# Patient Record
Sex: Male | Born: 1970 | Race: Black or African American | Hispanic: No | Marital: Married | Smoking: Never smoker
Health system: Southern US, Community
[De-identification: ages and names within clinical notes are randomized; demographics above are authoritative.]

## PROBLEM LIST (undated history)

## (undated) DIAGNOSIS — I10 Essential (primary) hypertension: Secondary | ICD-10-CM

## (undated) HISTORY — DX: Essential (primary) hypertension: I10

---

## 2003-02-21 ENCOUNTER — Encounter: Payer: Self-pay | Admitting: Internal Medicine

## 2003-02-21 ENCOUNTER — Encounter: Admission: RE | Admit: 2003-02-21 | Discharge: 2003-02-21 | Payer: Self-pay | Admitting: Internal Medicine

## 2005-06-19 ENCOUNTER — Emergency Department (HOSPITAL_COMMUNITY): Admission: EM | Admit: 2005-06-19 | Discharge: 2005-06-19 | Payer: Self-pay | Admitting: Emergency Medicine

## 2006-08-26 IMAGING — CR DG LUMBAR SPINE COMPLETE 4+V
5 series · 5 of 5 positions shown · non-contrast
Comparison: None.

CLINICAL DATA: The patient fell today and has low back pain.
 LUMBAR SPINE COMPLETE ? 4 VIEWS:

[view not recorded (1 of 5)]
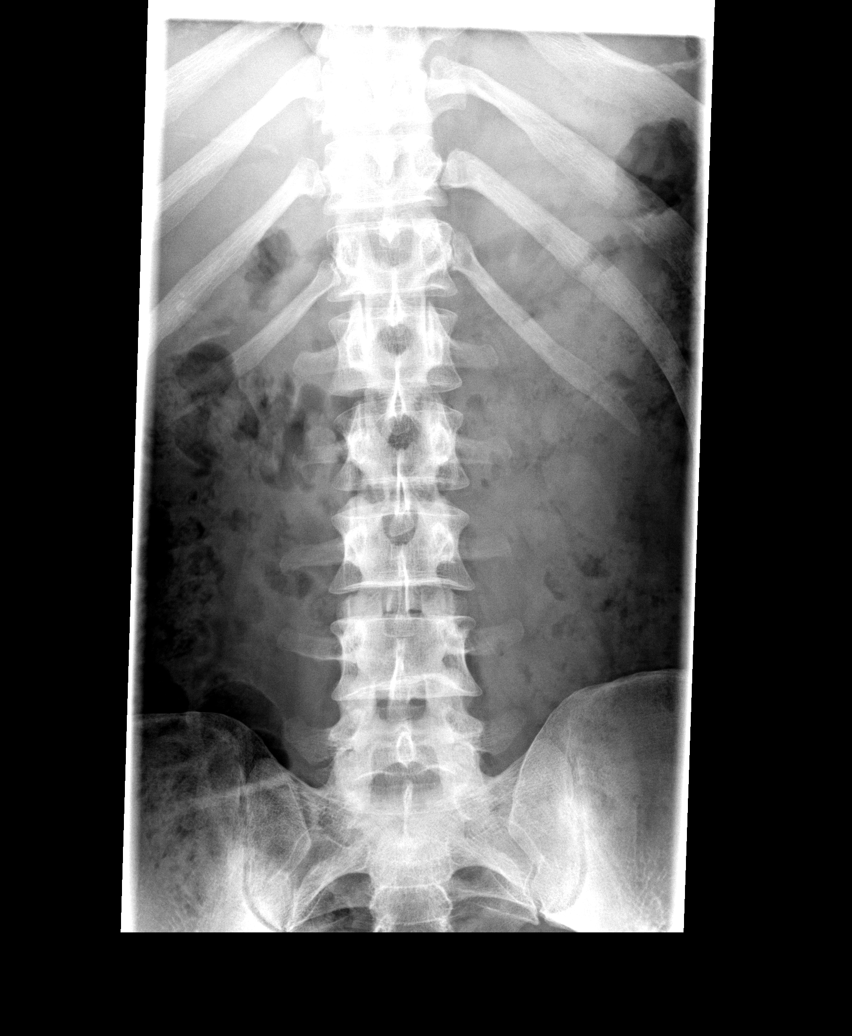

[view not recorded (2 of 5)]
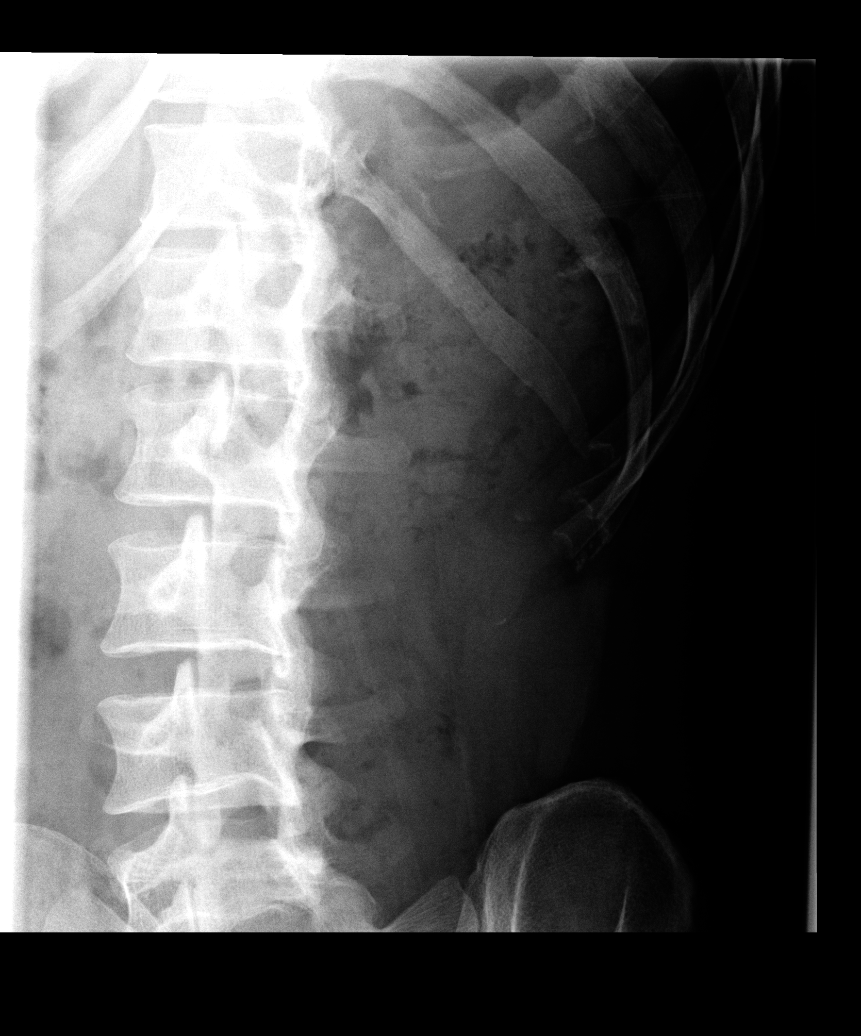

[view not recorded (3 of 5)]
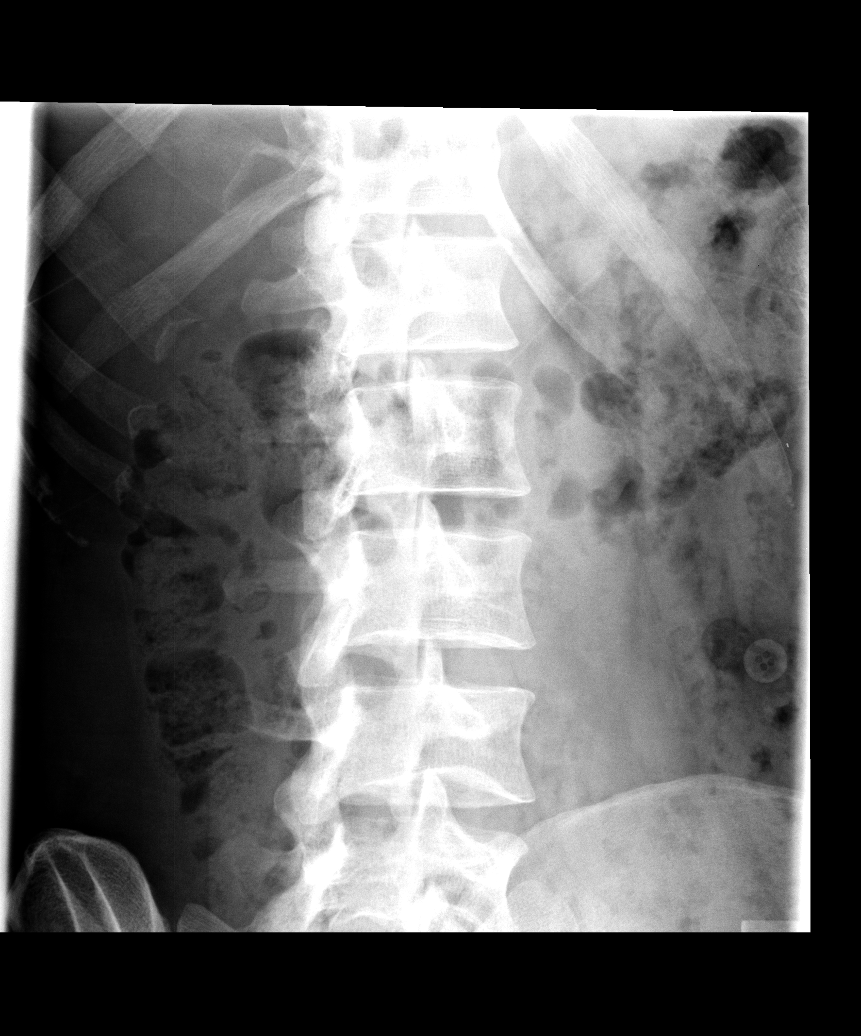

[view not recorded (4 of 5)]
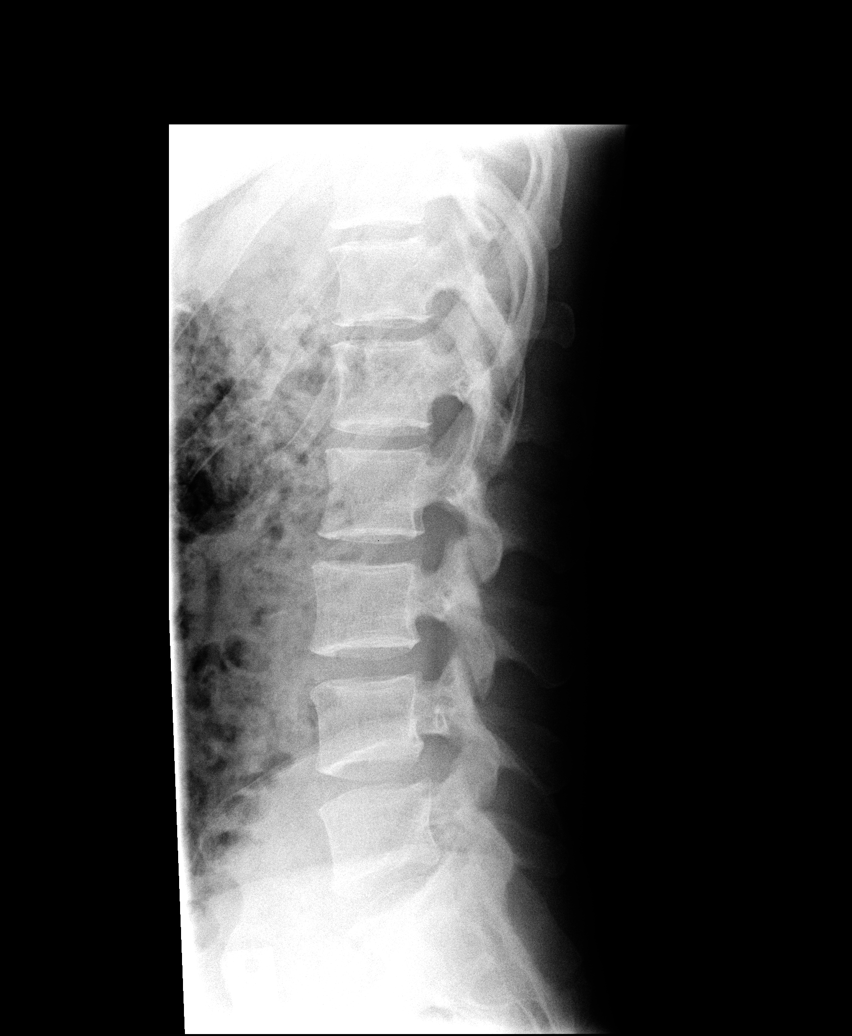

[view not recorded (5 of 5)]
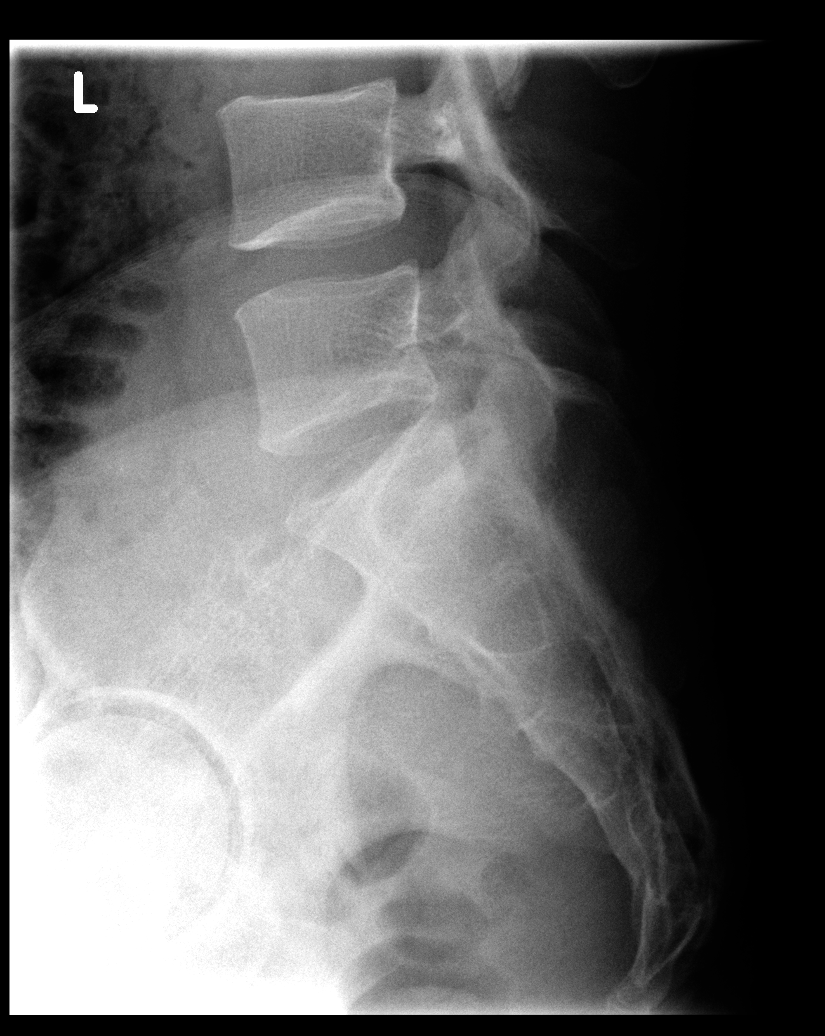

[5 of 5 positions shown; findings below may reference images not displayed]

Five lumbar type vertebrae.  No pars defects.  Satisfactory alignment.  No fracture.  There is mild sclerosis of the sacroiliac joints.
IMPRESSION: No abnormality.

## 2008-06-01 ENCOUNTER — Ambulatory Visit: Payer: Self-pay | Admitting: Family Medicine

## 2008-06-05 LAB — CONVERTED CEMR LAB
Basophils Absolute: 0 10*3/uL (ref 0.0–0.1)
Basophils Relative: 1 % (ref 0–1)
Eosinophils Absolute: 0.1 10*3/uL (ref 0.0–0.7)
Eosinophils Relative: 4 % (ref 0–5)
HCT: 46.8 % (ref 39.0–52.0)
Hemoglobin: 15.8 g/dL (ref 13.0–17.0)
Lymphocytes Relative: 48 % — ABNORMAL HIGH (ref 12–46)
Lymphs Abs: 1.6 10*3/uL (ref 0.7–4.0)
Monocytes Absolute: 0.3 10*3/uL (ref 0.1–1.0)
Monocytes Relative: 8 % (ref 3–12)
Neutro Abs: 1.4 10*3/uL — ABNORMAL LOW (ref 1.7–7.7)
Neutrophils Relative %: 40 % — ABNORMAL LOW (ref 43–77)
Platelets: 202 10*3/uL (ref 150–400)
RDW: 12.7 % (ref 11.5–15.5)

## 2008-06-09 ENCOUNTER — Telehealth: Payer: Self-pay | Admitting: Family Medicine

## 2008-11-22 ENCOUNTER — Encounter: Payer: Self-pay | Admitting: Family Medicine

## 2008-12-12 ENCOUNTER — Ambulatory Visit: Payer: Self-pay | Admitting: Family Medicine

## 2008-12-12 DIAGNOSIS — I10 Essential (primary) hypertension: Secondary | ICD-10-CM

## 2008-12-12 LAB — CONVERTED CEMR LAB
Cholesterol, target level: 200 mg/dL
HDL goal, serum: 40 mg/dL
LDL Goal: 160 mg/dL

## 2008-12-13 ENCOUNTER — Telehealth: Payer: Self-pay | Admitting: Family Medicine

## 2008-12-13 LAB — CONVERTED CEMR LAB: TSH: 3.152 microintl units/mL (ref 0.350–4.50)

## 2008-12-29 ENCOUNTER — Telehealth: Payer: Self-pay | Admitting: Family Medicine

## 2009-01-03 ENCOUNTER — Ambulatory Visit: Payer: Self-pay | Admitting: Family Medicine

## 2009-06-12 ENCOUNTER — Ambulatory Visit: Payer: Self-pay | Admitting: Family Medicine

## 2009-06-12 DIAGNOSIS — D72819 Decreased white blood cell count, unspecified: Secondary | ICD-10-CM | POA: Insufficient documentation

## 2009-06-13 LAB — CONVERTED CEMR LAB
ALT: 21 U/L
AST: 12 U/L
Albumin: 4.1 g/dL
Alkaline Phosphatase: 45 U/L
BUN: 14 mg/dL
CO2: 25 meq/L
Calcium: 9.2 mg/dL
Chloride: 101 meq/L
Cholesterol: 217 mg/dL — ABNORMAL HIGH
Creatinine, Ser: 1.21 mg/dL
Glucose, Bld: 94 mg/dL
HCT: 46.1 %
HDL: 54 mg/dL
Hemoglobin: 15.4 g/dL
LDL Cholesterol: 150 mg/dL — ABNORMAL HIGH
MCHC: 33.4 g/dL
MCV: 90.2 fL
Platelets: 161 K/uL
Potassium: 4.7 meq/L
RBC: 5.11 M/uL
RDW: 12.7 %
Sodium: 138 meq/L
TSH: 3.385 u[IU]/mL
Total Bilirubin: 0.4 mg/dL
Total CHOL/HDL Ratio: 4
Total Protein: 7.7 g/dL
Triglycerides: 65 mg/dL
VLDL: 13 mg/dL
WBC: 4.1 10*3/microliter

## 2009-08-31 ENCOUNTER — Ambulatory Visit: Payer: Self-pay | Admitting: Family Medicine

## 2009-08-31 DIAGNOSIS — E785 Hyperlipidemia, unspecified: Secondary | ICD-10-CM

## 2009-09-17 ENCOUNTER — Ambulatory Visit: Payer: Self-pay | Admitting: Family Medicine

## 2009-10-02 ENCOUNTER — Telehealth: Payer: Self-pay | Admitting: Family Medicine

## 2009-10-04 ENCOUNTER — Encounter: Payer: Self-pay | Admitting: Family Medicine

## 2009-10-05 LAB — CONVERTED CEMR LAB
Cholesterol: 220 mg/dL — ABNORMAL HIGH (ref 0–200)
HDL: 52 mg/dL (ref >39)
LDL Cholesterol: 152 mg/dL — ABNORMAL HIGH (ref 0–99)
Total CHOL/HDL Ratio: 4.2
Triglycerides: 79 mg/dL (ref <150)
VLDL: 16 mg/dL (ref 0–40)

## 2010-02-05 ENCOUNTER — Ambulatory Visit: Payer: Self-pay | Admitting: Family Medicine

## 2010-02-06 ENCOUNTER — Encounter: Payer: Self-pay | Admitting: Family Medicine

## 2010-02-06 LAB — CONVERTED CEMR LAB
Cholesterol: 221 mg/dL — ABNORMAL HIGH (ref 0–200)
HDL: 70 mg/dL (ref >39)
LDL Cholesterol: 141 mg/dL — ABNORMAL HIGH (ref 0–99)
Total CHOL/HDL Ratio: 3.2
Triglycerides: 48 mg/dL (ref <150)
VLDL: 10 mg/dL (ref 0–40)

## 2010-03-25 ENCOUNTER — Ambulatory Visit: Payer: Self-pay | Admitting: Family Medicine

## 2010-03-25 DIAGNOSIS — J309 Allergic rhinitis, unspecified: Secondary | ICD-10-CM | POA: Insufficient documentation

## 2010-03-25 DIAGNOSIS — D239 Other benign neoplasm of skin, unspecified: Secondary | ICD-10-CM | POA: Insufficient documentation

## 2010-04-06 ENCOUNTER — Ambulatory Visit: Payer: Self-pay | Admitting: Family Medicine

## 2010-04-06 DIAGNOSIS — H1030 Unspecified acute conjunctivitis, unspecified eye: Secondary | ICD-10-CM | POA: Insufficient documentation

## 2010-04-06 DIAGNOSIS — I10 Essential (primary) hypertension: Secondary | ICD-10-CM | POA: Insufficient documentation

## 2010-06-18 ENCOUNTER — Ambulatory Visit: Payer: Self-pay | Admitting: Family Medicine

## 2010-06-18 DIAGNOSIS — S139XXA Sprain of joints and ligaments of unspecified parts of neck, initial encounter: Secondary | ICD-10-CM | POA: Insufficient documentation

## 2010-10-20 ENCOUNTER — Ambulatory Visit: Payer: Self-pay | Admitting: Emergency Medicine

## 2010-10-20 DIAGNOSIS — A088 Other specified intestinal infections: Secondary | ICD-10-CM | POA: Insufficient documentation

## 2010-10-21 ENCOUNTER — Telehealth: Payer: Self-pay | Admitting: Family Medicine

## 2010-10-25 ENCOUNTER — Telehealth: Payer: Self-pay | Admitting: Family Medicine

## 2010-10-25 ENCOUNTER — Telehealth (INDEPENDENT_AMBULATORY_CARE_PROVIDER_SITE_OTHER): Payer: Self-pay | Admitting: *Deleted

## 2010-10-25 ENCOUNTER — Encounter: Payer: Self-pay | Admitting: Family Medicine

## 2010-12-22 ENCOUNTER — Encounter: Payer: Self-pay | Admitting: Orthopedic Surgery

## 2011-01-02 NOTE — Letter (Signed)
Summary: Out of Work  [facility]  [address]   [place], [place] [address]   Phone: [phone]  Fax: [phone]    October 20, 2010   Employee:  [employer]    To Whom It May Concern:   For Medical reasons, please excuse the above named employee from work for the following dates: Monday Oct 21, 2010  If you need additional information, please feel free to contact our office.         Sincerely,    Hoyt Koch MD

## 2011-01-02 NOTE — Letter (Signed)
Summary: Out of Work  [facility]  [address]   [place], [place] [address]   Phone: [phone]  Fax: [phone]    October 25, 2010   Employee:  Greg Taylor    To Whom It May Concern:   For Medical reasons, please excuse the above named employee from work today.   If you need additional information, please feel free to contact our office.         Sincerely,    Donna Christen MD

## 2011-01-02 NOTE — Assessment & Plan Note (Signed)
Summary: EYE INFEC?/TM   Vital Signs:  Patient Profile:   40 Years Old Male CC:      possible left eye infection X 10 days, improving Height:     74.25 inches Weight:      201 pounds O2 Sat:      100 % O2 treatment:    Room Air Temp:     97.4 degrees F oral Pulse rate:   73 / minute Pulse rhythm:   regular Resp:     14 per minute BP sitting:   125 / 86  (right arm) Cuff size:   regular  Pt. in pain?   no  Vitals Entered By: Lajean Saver RN (Apr 06, 2010 9:48 AM)               Vision Screening: Left eye w/o correction: 20 / 15 Right Eye w/o correction: 20 / 15 Both eyes w/o correction:  20/ 13  Color vision testing: normal      Vision Entered By: Lajean Saver RN (Apr 06, 2010 10:02 AM)    Updated Prior Medication List: LOSARTAN POTASSIUM-HCTZ 50-12.5 MG TABS (LOSARTAN POTASSIUM-HCTZ) Take 1 tablet by mouth once a day KETOTIFEN FUMARATE 0.025 % SOLN (KETOTIFEN FUMARATE) as needed q 12 hours ALLEGRA-D 24 HOUR 180-240 MG XR24H-TAB (FEXOFENADINE-PSEUDOEPHEDRINE) once daily  Current Allergies (reviewed today): No known allergies History of Present Illness Chief Complaint: possible left eye infection X 10 days, improving History of Present Illness: Subjective:  Patient complains of onset of mild eye redness and drainage about 10 days ago.  There has been no pain or foreign body sensation, and no change in vision.  He had had mild URI symptoms (now resolved) prior to the redness.  He has seasonal allergies, and sinus congestion is now minimal since he started taking Allegra D.   He obtained OTC Ketoprofen eye drops and eye symptoms have almost completely resolved, except for some mild early morning redness and drainage.  REVIEW OF SYSTEMS Constitutional Symptoms      Denies fever, chills, night sweats, weight loss, weight gain, and fatigue.  Eyes       Complains of eye drainage.      Denies change in vision, eye pain, glasses, contact lenses, and eye surgery.       Comments: left eye, redness, irritation Ear/Nose/Throat/Mouth       Denies hearing loss/aids, change in hearing, ear pain, ear discharge, dizziness, frequent runny nose, frequent nose bleeds, sinus problems, sore throat, hoarseness, and tooth pain or bleeding.  Respiratory       Denies dry cough, productive cough, wheezing, shortness of breath, asthma, bronchitis, and emphysema/COPD.  Cardiovascular       Denies murmurs, chest pain, and tires easily with exhertion.    Gastrointestinal       Denies stomach pain, nausea/vomiting, diarrhea, constipation, blood in bowel movements, and indigestion. Genitourniary       Denies painful urination, kidney stones, and loss of urinary control. Neurological       Denies paralysis, seizures, and fainting/blackouts. Musculoskeletal       Denies muscle pain, joint pain, joint stiffness, decreased range of motion, redness, swelling, muscle weakness, and gout.  Skin       Denies bruising, unusual mles/lumps or sores, and hair/skin or nail changes.  Psych       Denies mood changes, temper/anger issues, anxiety/stress, speech problems, depression, and sleep problems. Other Comments: patient c/o left eye redness, irritation, and drainage for about 10  days, taken OTC drops, some improvement   Past History:  Past Medical History:  Hypertension  Past Surgical History: Reviewed history from 06/01/2008 and no changes required. None  Family History: Reviewed history from 06/12/2009 and no changes required. Father has HTN, DM  GF with stroke  Social History: Reviewed history from 06/12/2009 and no changes required. 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  Never Smoked Alcohol use-rare Drug use-no Regular exercise-occ   Objective:  Appearance:  Patient appears healthy, stated age, and in no acute distress  Eyes:  Pupils are equal, round, and reactive to light and accomdation.  Extraocular movement is intact.  Left  conjunctivae slightly injected medially.  No discharge noted.  No lid swelling or tenderness.  No photophobia. Ears:  Canals normal.  Tympanic membranes normal.   Nose:  Normal septum.  Normal turbinates, mildly congested.   No sinus tenderness present.  Pharynx:  Normal  Neck:  Supple.  No adenopathy is present.  Assessment New Problems: CONJUNCTIVITIS, ACUTE, LEFT (ICD-372.00) HYPERTENSION (ICD-401.9)  ? MILD BACTERIAL CONJUNCTIVITIS  Plan New Medications/Changes: SULFACETAMIDE SODIUM 10 % SOLN (SULFACETAMIDE SODIUM) 1 or 2 gtts in affected eye QID for 5 to 7 days.  #10cc x 0, 04/06/2010, Donna Christen MD  New Orders: New Patient Level III [phone] Planning Comments:   Continue Allegra D and Ketoprofen eye drops.   Begin sulfacetamide ophth drops for 5 to 7 days to left eye.   Follow-up with ophthalmologist if not resolved one week.   The patient and/or caregiver has been counseled thoroughly with regard to medications prescribed including dosage, schedule, interactions, rationale for use, and possible side effects and they verbalize understanding.  Diagnoses and expected course of recovery discussed and will return if not improved as expected or if the condition worsens. Patient and/or caregiver verbalized understanding.  Prescriptions: SULFACETAMIDE SODIUM 10 % SOLN (SULFACETAMIDE SODIUM) 1 or 2 gtts in affected eye QID for 5 to 7 days.  #10cc x 0   Entered and Authorized by:   Donna Christen MD   Signed by:   Donna Christen MD on 04/06/2010   Method used:   Print then Give to Patient   RxID:   [id number]

## 2011-01-02 NOTE — Progress Notes (Signed)
Summary: Pt requesting excused work note for additional day  Phone Note Call from Patient   Caller: Patient Summary of Call: Pt came into [facility] on 10/25/10 wanting a work note that excused him from work 10/22/10-10/24/10. Pt was seen at [facility] on 11/20/11and given a work note excusing him from work on 10/21/10. Pt was advsd by physician to F/U w/ his PCP if Sx did not improve or got worse. PCP was contacted on 10/21/10 by pt's wife re Dx of pt. PCP's nurse  responded to the pt's wife concerns. Per Dr. Cathren Harsh, pt will need to obtain excused work note for additional days from his PCP, courtesy excused work was given for today before Dr. Cathren Harsh had a chance to read 10/20/10 ov. Pt was advsd and he understood. Pt stated no problem. Initial call taken by: Areta Haber CMA,  October 25, 2010 3:34 PM

## 2011-01-02 NOTE — Assessment & Plan Note (Signed)
Summary: AR, skin   Vital Signs:  Patient profile:   40 year old male Height:      74.25 inches Weight:      199 pounds Temp:     97.9 degrees F oral Pulse rate:   79 / minute BP sitting:   120 / 80  (left arm) Cuff size:   regular  Vitals Entered By: Kathlene November (March 25, 2010 11:25 AM) CC: sore throat, drainage, some sneezing, nasal congestion this morning- ? allergies- been taking allergy med OTC which thinks helps some   Primary Care Provider:  Nani Gasser MD  CC:  sore throat, drainage, some sneezing, and nasal congestion this morning- ? allergies- been taking allergy med OTC which thinks helps some.  History of Present Illness: sore throat, drainage, some sneezing, nasal congestion this morning- ? allergies- been taking allergy med OTC which thinks helps some.  Started last week. runny nose.  Tried [employer] brand Claritin- D.  Tried it for about 4 days.  No fever.  Worse at night.  Gargling.    Current Medications (verified): 1)  Losartan Potassium-Hctz 50-12.5 Mg Tabs (Losartan Potassium-Hctz) .... Take 1 Tablet By Mouth Once A Day  Allergies (verified): No Known Drug Allergies  Comments:  Nurse/Medical Assistant: The patient's medications and allergies were reviewed with the patient and were updated in the Medication and Allergy Lists. Kathlene November (March 25, 2010 11:26 AM)  Physical Exam  General:  Well-developed,well-nourished,in no acute distress; alert,appropriate and cooperative throughout examination Head:  Normocephalic and atraumatic without obvious abnormalities. No apparent alopecia or balding. Eyes:  No corneal or conjunctival inflammation noted. EOMI. Perrla.  Ears:  External ear exam shows no significant lesions or deformities.  Otoscopic examination reveals clear canals, tympanic membranes are intact bilaterally without bulging, retraction, inflammation or discharge. Hearing is grossly normal bilaterally. Nose:  External nasal examination  shows no deformity or inflammation. Nasal mucosa are pink and moist without lesions or exudates.  turbrinates are swollen and pale.  Mouth:  Oral mucosa and oropharynx without lesions or exudates.  Teeth in good repair. Neck:  No deformities, masses, or tenderness noted. NO TM  Lungs:  Normal respiratory effort, chest expands symmetrically. Lungs are clear to auscultation, no crackles or wheezes. Heart:  Normal rate and regular rhythm. S1 and S2 normal without gallop, murmur, click, rub or other extra sounds. Skin:  no rashes.   Psych:  Cognition and judgment appear intact. Alert and cooperative with normal attention span and concentration. No apparent delusions, illusions, hallucinations   Impression & Recommendations:  Problem # 1:  ALLERGIC RHINITIS (ICD-477.9) Discused adding a nasal steroid to his antihistamine. REviewed how to appropriately use a nasal steroid.  Wean after a few weeks if feeling better. Call if not better in one week.  No signs of sinusitis st this point.  His updated medication list for this problem includes:    Fluticasone Propionate 50 Mcg/act Susp (Fluticasone propionate) .Marland Kitchen... 2 sprays in each nostril daily  Problem # 2:  DERMATOFIBROMA (ICD-216.9) On left upper chest. Pt noticed it about 1-2 years ago. No changes and nontender but just wanted to know what it was. Gave reassurance adn reviewed symptoms to monitor for.   Complete Medication List: 1)  Losartan Potassium-hctz 50-12.5 Mg Tabs (Losartan potassium-hctz) .... Take 1 tablet by mouth once a day 2)  Fluticasone Propionate 50 Mcg/act Susp (Fluticasone propionate) .... 2 sprays in each nostril daily  Patient Instructions: 1)  Try the nasal spray.  2 sprays in each nostril once a day. Continue your claritin-D.   Prescriptions: FLUTICASONE PROPIONATE 50 MCG/ACT SUSP (FLUTICASONE PROPIONATE) 2 sprays in each nostril daily  #1 x 2   Entered and Authorized by:   Nani Gasser MD   Signed by:   Nani Gasser MD on 03/25/2010   Method used:   [facility] to        [facility] [phone]* (retail)       [address] [place], [place]  [address]       Ph: [phone] or [phone]       Fax: [phone]   RxID:   [id number]

## 2011-01-02 NOTE — Progress Notes (Signed)
Summary: gastroenteritis  Phone Note Call from Patient   Caller: Patient Call For: Nani Gasser MD Summary of Call: pt's wife called and states pt is having nausea and vomiting, chills but no fever. was seen at urgent care and was told he had a stomach bug. Spouse wanted reassurance. Advised for him to keep  drinking fluids.Call if new signs develop fever ect. and just get rest. if stomach starts to not feel as nauseaous can try to add bland food rice,toast...BRAT diet. Wife also wanted to know if he should stay on his BP because he has been checking it at home and it has been around 130/82 and the last visits before his BP was nml.please advise Initial call taken by: Avon Gully CMA, Duncan Dull),  October 21, 2010 11:57 AM

## 2011-01-02 NOTE — Progress Notes (Signed)
  Phone Note Call from Patient   Caller: Patient Summary of Call: Patient is calling to see if we will write a note for today due to him still being ill with the stomach flu. Nh Initial call taken by: Dannette Barbara,  October 25, 2010 11:03 AM      Note written. Donna Christen MD  October 25, 2010 12:27 PM

## 2011-01-02 NOTE — Progress Notes (Signed)
Summary: BP meds  Phone Note Call from Patient   Caller: Spouse Call For: Nani Gasser MD Summary of Call: wife wanted to know if pt needed to cont bp meds because his Bp at home has been 130/80 at home and the last few visits before he was put on BP med Bp was nml.Please advise Initial call taken by: Avon Gully CMA, Duncan Dull),  October 21, 2010 4:30 PM  Follow-up for Phone Call        He may be able to stay off the medicine but needs to come in for nurse check off the medicine.  Follow-up by: Nani Gasser MD,  October 21, 2010 4:46 PM  Additional Follow-up for Phone Call Additional follow up Details #1::        called wife and left a messge on her phone Additional Follow-up by: Avon Gully CMA, Duncan Dull),  October 22, 2010 9:09 AM

## 2011-01-02 NOTE — Assessment & Plan Note (Signed)
Summary: HTN, lipids.    Vital Signs:  Patient profile:   40 year old male Height:      74.25 inches Weight:      198 pounds Pulse rate:   90 / minute BP sitting:   121 / 74  (left arm) Cuff size:   regular  Vitals Entered By: Kathlene November (February 05, 2010 1:07 PM) CC: followup BP   Primary Care Provider:  Nani Gasser MD  CC:  followup BP.  History of Present Illness: Has lost 12 lbs. Home BPs in 12-130s/80s.  Yesterday 140/90 at home and did have ribs the day before. . didn't take it take BP med today. Not actively exercising.  NO CP, SOB or dizziness.   Current Medications (verified): 1)  Losartan Potassium-Hctz 50-12.5 Mg Tabs (Losartan Potassium-Hctz) .... Take 1 Tablet By Mouth Once A Day  Allergies (verified): No Known Drug Allergies  Comments:  Nurse/Medical Assistant: The patient's medications and allergies were reviewed with the patient and were updated in the Medication and Allergy Lists. Kathlene November (February 05, 2010 1:08 PM)  Physical Exam  General:  Well-developed,well-nourished,in no acute distress; alert,appropriate and cooperative throughout examination Lungs:  Normal respiratory effort, chest expands symmetrically. Lungs are clear to auscultation, no crackles or wheezes. Heart:  Normal rate and regular rhythm. S1 and S2 normal without gallop, murmur, click, rub or other extra sounds. Skin:  no rashes.   Psych:  Cognition and judgment appear intact. Alert and cooperative with normal attention span and concentration. No apparent delusions, illusions, hallucinations   Impression & Recommendations:  Problem # 1:  HYPERTENSION, BENIGN (ICD-401.1) Discussed how salt can really affect his BP.  Bp looks great. He is going to bring in hom  cuff to compare to ours since he seems to be running higher at home than here.  Home cuff was running high compared to our machine. He will change the batteries and fu in one week to recheck. He may be ableto come down on  his dose of meds if he is actually running lower at home.  His updated medication list for this problem includes:    Losartan Potassium-hctz 50-12.5 Mg Tabs (Losartan potassium-hctz) .Marland Kitchen... Take 1 tablet by mouth once a day  Problem # 2:  HYPERLIPIDEMIA (ICD-272.4) Has lost 12 lbs. Never filled the rx for the statin. He wants to recheck his lipids with the weight loss.  Orders: T-Lipid Profile ([id number])  Complete Medication List: 1)  Losartan Potassium-hctz 50-12.5 Mg Tabs (Losartan potassium-hctz) .... Take 1 tablet by mouth once a day

## 2011-01-02 NOTE — Assessment & Plan Note (Signed)
Summary: allergies/ neck pain/ HTN   Vital Signs:  Patient profile:   40 year old male Height:      74.25 inches Weight:      199 pounds BMI:     25.47 O2 Sat:      98 % on Room air Pulse rate:   71 / minute BP sitting:   143 / 96  (left arm) Cuff size:   large  Vitals Entered By: Payton Spark CMA (June 18, 2010 11:49 AM)  O2 Flow:  Room air CC: Would like Rx written for Allegra. Also c/o R sided neck pain and stiffness x 4 days.   Primary Care Provider:  Nani Gasser MD  CC:  Would like Rx written for Allegra. Also c/o R sided neck pain and stiffness x 4 days.Greg Taylor  History of Present Illness: 40 yo AAM presents for R sided neck pain x 4 days.  Denies any trauma or radiation of pain.  Ibuprofen not really helping.  No previous injury.  Does a lot of lifting, working at [employer].  Wants RX for his Allegra D, generic.  Also, he stopped his BP medicine as he just didn't want to take it anymore.    Current Medications (verified): 1)  Allegra-D 24 Hour 180-240 Mg Xr24h-Tab (Fexofenadine-Pseudoephedrine) .... Once Daily  Allergies (verified): No Known Drug Allergies  Past History:  Past Medical History: Reviewed history from 04/06/2010 and no changes required.  Hypertension  Social History: Reviewed history from 06/12/2009 and no changes required. 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  Never Smoked Alcohol use-rare Drug use-no Regular exercise-occ  Review of Systems      See HPI  Physical Exam  General:  alert, well-developed, well-nourished, and well-hydrated.   Head:  normocephalic and atraumatic.   Eyes:  pupils equal, pupils round, and pupils reactive to light.   Mouth:  pharynx pink and moist.   Neck:  supple and no masses.  slightly limited with active C spine rotation and SB to the R Lungs:  Normal respiratory effort, chest expands symmetrically. Lungs are clear to auscultation, no crackles or wheezes. Heart:  Normal rate  and regular rhythm. S1 and S2 normal without gallop, murmur, click, rub or other extra sounds. Extremities:  no LE edema Skin:  color normal.   Psych:  good eye contact, not anxious appearing, and not depressed appearing.     Impression & Recommendations:  Problem # 1:  CERVICAL STRAIN (ICD-847.0)  MSK strain, R neck, mild. Failed to improve with NSAIDs after just 4 days.  He requested a visit to the chiropractor since he does a lot of lifting at work.  Will refer him for manipulation.  Orders: Chiropractic Referral (Chiro)  Problem # 2:  HYPERTENSION (ICD-401.9) Restart Losartan HCTZ with high BP reading today.  Educated pt on the importance of treating HTN.  he will f/u with Dr Linford Arnold in 3 mos. The following medications were removed from the medication list:    Losartan Potassium-hctz 50-12.5 Mg Tabs (Losartan potassium-hctz) .Greg Taylor... Take 1 tablet by mouth once a day His updated medication list for this problem includes:    Losartan Potassium-hctz 50-12.5 Mg Tabs (Losartan potassium-hctz) .Greg Taylor... 1 tabs by mouth qam  Problem # 3:  ALLERGIC RHINITIS (ICD-477.9) RX for generic Allegra D given.  Watch BP with systemic decongestant use.  Complete Medication List: 1)  Fexofenadine-pseudoephedrine 180-240 Mg Xr24h-tab (Fexofenadine-pseudoephedrine) .Greg Taylor.. 1 tab by mouth qam 2)  Losartan Potassium-hctz 50-12.5  Mg Tabs (Losartan potassium-hctz) .Greg Taylor.. 1 tabs by mouth qam  Patient Instructions: 1)  Restart BP medicine daily. 2)  RX generic Allegra D given. 3)  Will try to get you into Dr Hall Busing office for neck pain. 4)  Use Advil 600 mg (3 tabs) up to 3 x a day for neck pain; take with food. 5)  Return for f/u high BP in 3 mos. Prescriptions: LOSARTAN POTASSIUM-HCTZ 50-12.5 MG TABS (LOSARTAN POTASSIUM-HCTZ) 1 tabs by mouth qAM  #30 x 2   Entered and Authorized by:   Seymour Bars DO   Signed by:   Seymour Bars DO on 06/18/2010   Method used:   [facility] to        [facility]  [phone]* (retail)       [address] [place], [place]  [address]       Ph: [phone] or [phone]       Fax: [phone]   RxID:   [id number] FEXOFENADINE-PSEUDOEPHEDRINE 180-240 MG XR24H-TAB (FEXOFENADINE-PSEUDOEPHEDRINE) 1 tab by mouth qAM  #30 x 6   Entered and Authorized by:   Seymour Bars DO   Signed by:   Seymour Bars DO on 06/18/2010   Method used:   Electronically to        [facility] [phone]* (retail)       [address] [place], [place]  [address]       Ph: [phone] or [phone]       Fax: [phone]   RxID:   [id number]

## 2011-01-02 NOTE — Assessment & Plan Note (Signed)
Summary: NAUSEA/CHILLS/TM   Vital Signs:  Patient Profile:   40 Years Old Male CC:      Headache, vomiting x 1 chills since yesterday afternoon Height:     74.25 inches O2 Sat:      99 % O2 treatment:    Room Air Temp:     98.9 degrees F oral Pulse rate:   92 / minute Pulse rhythm:   regular Resp:     12 per minute BP sitting:   145 / 100  (left arm) Cuff size:   regular  Vitals Entered By: Emilio Math (October 20, 2010 5:30 PM)                  Current Allergies: No known allergies History of Present Illness Chief Complaint: Headache, vomiting x 1 chills since yesterday afternoon History of Present Illness: HA, abd pain, vomit x1 due to nausea, chills x2 days.  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  Also with runny nose, sinus congestion, cough, hoarseness, and fatigue.  He hasn't felt like eating today and only had some grits this morning.  He is taking Allegra for his allergies.  He has HTN but stopped his HCTZ.  Current Meds FEXOFENADINE-PSEUDOEPHEDRINE 180-240 MG XR24H-TAB (FEXOFENADINE-PSEUDOEPHEDRINE) 1 tab by mouth qAM LOSARTAN POTASSIUM-HCTZ 50-12.5 MG TABS (LOSARTAN POTASSIUM-HCTZ) 1 tabs by mouth qAM PROMETHAZINE HCL 25 MG TABS (PROMETHAZINE HCL) 1 tab by mouth Q6 hours as needed for nausea  REVIEW OF SYSTEMS Constitutional Symptoms       Complains of chills.     Denies fever, night sweats, weight loss, weight gain, and fatigue.  Eyes       Denies change in vision, eye pain, eye discharge, glasses, contact lenses, and eye surgery. Ear/Nose/Throat/Mouth       Complains of frequent runny nose, sinus problems, and sore throat.      Denies hearing loss/aids, change in hearing, ear pain, ear discharge, dizziness, frequent nose bleeds, hoarseness, and tooth pain or bleeding.  Respiratory       Complains of dry cough.      Denies productive cough, wheezing, shortness of breath, asthma, bronchitis, and emphysema/COPD.  Cardiovascular       Denies  murmurs, chest pain, and tires easily with exhertion.    Gastrointestinal       Complains of nausea/vomiting.      Denies stomach pain, diarrhea, constipation, blood in bowel movements, and indigestion. Genitourniary       Denies painful urination, kidney stones, and loss of urinary control. Neurological       Complains of headaches.      Denies paralysis, seizures, and fainting/blackouts. Musculoskeletal       Complains of muscle weakness.      Denies muscle pain, joint pain, joint stiffness, decreased range of motion, redness, swelling, and gout.  Skin       Denies bruising, unusual mles/lumps or sores, and hair/skin or nail changes.  Psych       Denies mood changes, temper/anger issues, anxiety/stress, speech problems, depression, and sleep problems.  Past History:  Past Medical History: Reviewed history from 04/06/2010 and no changes required.  Hypertension  Past Surgical History: Reviewed history from 06/01/2008 and no changes required. None  Family History: Reviewed history from 06/12/2009 and no changes required. Father has HTN, DM  GF with stroke  Social History: Reviewed history from 06/12/2009 and no changes required. 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 Married to [employer] with no  kids.  Never Smoked Alcohol use-rare Drug use-no Regular exercise-occ Physical Exam General appearance: well developed, well nourished, no acute distress Pupils: equal, round, reactive to light Ears: normal, no lesions or deformities Nasal: clear discharge Oral/Pharynx: tongue normal, posterior pharynx without erythema or exudate Abdomen: +BS, no guarding, no rebound, mild epigastric tenderness, soft Skin: no obvious rashes or lesions MSE: oriented to time, place, and person Assessment New Problems: GASTROENTERITIS, VIRAL (ICD-008.8)   Patient Education: Patient and/or caregiver instructed in the following: rest.  Plan New Medications/Changes: PROMETHAZINE HCL 25 MG TABS  (PROMETHAZINE HCL) 1 tab by mouth Q6 hours as needed for nausea  #15 x 0, 10/20/2010, Hoyt Koch MD  New Orders: Est. Patient Level III [phone] Planning Comments:   Parke Simmers diet and avoid spicy/greasy/fried foods, but increase calories and increase hydration (Gatorade) Work note given for tomorrow Rest Use Phenergan Rx for nausea Follow-up with your primary care physician if not improving or if getting worse, but may take a few days to get better and may actually develop mild diarrhea before you get better.   The patient and/or caregiver has been counseled thoroughly with regard to medications prescribed including dosage, schedule, interactions, rationale for use, and possible side effects and they verbalize understanding.  Diagnoses and expected course of recovery discussed and will return if not improved as expected or if the condition worsens. Patient and/or caregiver verbalized understanding.  Prescriptions: PROMETHAZINE HCL 25 MG TABS (PROMETHAZINE HCL) 1 tab by mouth Q6 hours as needed for nausea  #15 x 0   Entered and Authorized by:   Hoyt Koch MD   Signed by:   Hoyt Koch MD on 10/20/2010   Method used:   Print then Give to Patient   RxID:   [id number]   Orders Added: 1)  Est. Patient Level III [08657]

## 2011-10-09 ENCOUNTER — Ambulatory Visit: Payer: Self-pay | Admitting: Family Medicine

## 2012-05-20 ENCOUNTER — Encounter: Payer: Self-pay | Admitting: *Deleted

## 2012-05-24 ENCOUNTER — Encounter: Payer: Self-pay | Admitting: Physician Assistant

## 2012-05-24 ENCOUNTER — Ambulatory Visit (INDEPENDENT_AMBULATORY_CARE_PROVIDER_SITE_OTHER): Payer: Self-pay | Admitting: Physician Assistant

## 2012-05-24 VITALS — BP 126/84 | HR 77 | Ht 74.0 in | Wt 196.0 lb

## 2012-05-24 DIAGNOSIS — Z131 Encounter for screening for diabetes mellitus: Secondary | ICD-10-CM

## 2012-05-24 DIAGNOSIS — Z13220 Encounter for screening for lipoid disorders: Secondary | ICD-10-CM

## 2012-05-24 DIAGNOSIS — R030 Elevated blood-pressure reading, without diagnosis of hypertension: Secondary | ICD-10-CM

## 2012-05-24 DIAGNOSIS — Z0000 Encounter for general adult medical examination without abnormal findings: Secondary | ICD-10-CM

## 2012-05-24 LAB — LIPID PANEL
Cholesterol: 222 mg/dL — AB (ref 0–200)
HDL: 68 mg/dL (ref 35–70)
Triglycerides: 53 mg/dL (ref 40–160)

## 2012-05-24 NOTE — Progress Notes (Signed)
  Subjective:    Patient ID: Greg Taylor, male    DOB: 06/05/1971, 41 y.o.   MRN: [id number]  HPI Patient presents to clinic for CPE. NO new social or family history changes for the patient. His bp was elevated but recheck during visit and back in the 124/84. He is not taking his Hyzaar and denies CP, palpations or SOB. He feels great. He denies and urinary symptoms. He is fasting. He has a history of hyperlipidemia but not taking any meds. Will recheck today.    Review of Systems     Objective:   Physical Exam  Constitutional: He is oriented to person, place, and time. He appears well-developed and well-nourished.  HENT:  Head: Normocephalic and atraumatic.  Right Ear: External ear normal.  Left Ear: External ear normal.  Nose: Nose normal.  Mouth/Throat: Oropharynx is clear and moist. No oropharyngeal exudate.       TM's normal bilaterally.  Eyes: Conjunctivae and EOM are normal. Pupils are equal, round, and reactive to light.  Neck: Normal range of motion. Neck supple. No thyromegaly present.  Cardiovascular: Normal rate, regular rhythm, normal heart sounds and intact distal pulses.   No murmur heard. Pulmonary/Chest: Effort normal and breath sounds normal. He has no wheezes.  Abdominal: Soft. Bowel sounds are normal. He exhibits no distension. There is no tenderness.  Genitourinary: Rectum normal and prostate normal.  Musculoskeletal: Normal range of motion.  Lymphadenopathy:    He has no cervical adenopathy.  Neurological: He is alert and oriented to person, place, and time. He has normal reflexes. No cranial nerve deficit.  Skin: Skin is warm and dry.  Psychiatric: He has a normal mood and affect. His behavior is normal.          Assessment & Plan:  CPE- Will get lipid, PSA, and CmP today and call with results. I would like for pt to keep bp diary for the next 2 weeks and fax results Follow up with nurse visit to check bp in next month or so. After recheck pt's bp  was great. I do think you would benefit from a low salt diet and handout was given. Vaccines up to date. Encouraged patient to encorporate regular exercise and healthy balanced diet into everyday life. Calcium in 4 serviings of daily is another way you can build good bone health.

## 2012-05-24 NOTE — Patient Instructions (Addendum)
141/89 next check 126/84. Start checking bp daily for next 2 weeks. Fax info. In meantime avoid salt and exercise regularly. Will call with lab results.   1.5 Gram Low Sodium Diet A 1.5 gram sodium diet restricts the amount of sodium in the diet to no more than 1.5 g or 1500 mg daily. The American Heart Association recommends Americans over the age of 68 to consume no more than 1500 mg of sodium each day to reduce the risk of developing high blood pressure. Research also shows that limiting sodium may reduce heart attack and stroke risk. Many foods contain sodium for flavor and sometimes as a preservative. When the amount of sodium in a diet needs to be low, it is important to know what to look for when choosing foods and drinks. The following includes some information and guidelines to help make it easier for you to adapt to a low sodium diet. QUICK TIPS  Do not add salt to food.   Avoid convenience items and fast food.   Choose unsalted snack foods.   Buy lower sodium products, often labeled as "lower sodium" or "no salt added."   Check food labels to learn how much sodium is in 1 serving.   When eating at a restaurant, ask that your food be prepared with less salt or none, if possible.  READING FOOD LABELS FOR SODIUM INFORMATION The nutrition facts label is a good place to find how much sodium is in foods. Look for products with no more than 400 mg of sodium per serving. Remember that 1.5 g = 1500 mg. The food label may also list foods as:  Sodium-free: Less than 5 mg in a serving.   Very low sodium: 35 mg or less in a serving.   Low-sodium: 140 mg or less in a serving.   Light in sodium: 50% less sodium in a serving. For example, if a food that usually has 300 mg of sodium is changed to become light in sodium, it will have 150 mg of sodium.   Reduced sodium: 25% less sodium in a serving. For example, if a food that usually has 400 mg of sodium is changed to reduced sodium, it will  have 300 mg of sodium.  CHOOSING FOODS Grains  Avoid: Salted crackers and snack items. Some cereals, including instant hot cereals. Bread stuffing and biscuit mixes. Seasoned rice or pasta mixes.   Choose: Unsalted snack items. Low-sodium cereals, oats, puffed wheat and rice, shredded wheat. English muffins and bread. Pasta.  Meats  Avoid: Salted, canned, smoked, spiced, pickled meats, including fish and poultry. Bacon, ham, sausage, cold cuts, hot dogs, anchovies.   Choose: Low-sodium canned tuna and salmon. Fresh or frozen meat, poultry, and fish.  Dairy  Avoid: Processed cheese and spreads. Cottage cheese. Buttermilk and condensed milk. Regular cheese.   Choose: Milk. Low-sodium cottage cheese. Yogurt. Sour cream. Low-sodium cheese.  Fruits and Vegetables  Avoid: Regular canned vegetables. Regular canned tomato sauce and paste. Frozen vegetables in sauces. Olives. Rosita Fire. Relishes. Sauerkraut.   Choose: Low-sodium canned vegetables. Low-sodium tomato sauce and paste. Frozen or fresh vegetables. Fresh and frozen fruit.  Condiments  Avoid: Canned and packaged gravies. Worcestershire sauce. Tartar sauce. Barbecue sauce. Soy sauce. Steak sauce. Ketchup. Onion, garlic, and table salt. Meat flavorings and tenderizers.   Choose: Fresh and dried herbs and spices. Low-sodium varieties of mustard and ketchup. Lemon juice. Tabasco sauce. Horseradish.  SAMPLE 1.5 GRAM SODIUM MEAL PLAN Breakfast / Sodium (mg)  1 cup  low-fat milk / 143 mg   1 whole-wheat English muffin / 240 mg   1 tbs heart-healthy margarine / 153 mg   1 hard-boiled egg / 139 mg   1 small orange / 0 mg  Lunch / Sodium (mg)  1 cup raw carrots / 76 mg   2 tbs no salt added peanut butter / 5 mg   2 slices whole-wheat bread / 270 mg   1 tbs jelly / 6 mg    cup red grapes / 2 mg  Dinner / Sodium (mg)  1 cup whole-wheat pasta / 2 mg   1 cup low-sodium tomato sauce / 73 mg   3 oz lean ground beef / 57 mg    1 small side salad (1 cup raw spinach leaves,  cup cucumber,  cup yellow bell pepper) with 1 tsp olive oil and 1 tsp red wine vinegar / 25 mg  Snack / Sodium (mg)  1 container low-fat vanilla yogurt / 107 mg   3 graham cracker squares / 127 mg  Nutrient Analysis  Calories: 1745   Protein: 75 g   Carbohydrate: 237 g   Fat: 57 g   Sodium: 1425 mg  Document Released: 11/17/2005 Document Revised: 11/06/2011 Document Reviewed: 02/18/2010 [facility] Patient Information 2012 [place], [place].

## 2012-06-07 ENCOUNTER — Encounter: Payer: Self-pay | Admitting: *Deleted

## 2013-06-28 ENCOUNTER — Encounter: Payer: BC Managed Care – PPO | Admitting: Family Medicine

## 2015-02-21 ENCOUNTER — Encounter: Payer: Self-pay | Admitting: Physician Assistant

## 2015-02-26 ENCOUNTER — Ambulatory Visit (INDEPENDENT_AMBULATORY_CARE_PROVIDER_SITE_OTHER): Payer: 59 | Admitting: Physician Assistant

## 2015-02-26 ENCOUNTER — Encounter: Payer: Self-pay | Admitting: Physician Assistant

## 2015-02-26 VITALS — BP 142/95 | HR 101 | Ht 74.0 in | Wt 204.0 lb

## 2015-02-26 DIAGNOSIS — R030 Elevated blood-pressure reading, without diagnosis of hypertension: Secondary | ICD-10-CM | POA: Diagnosis not present

## 2015-02-26 DIAGNOSIS — Z131 Encounter for screening for diabetes mellitus: Secondary | ICD-10-CM

## 2015-02-26 DIAGNOSIS — Z0000 Encounter for general adult medical examination without abnormal findings: Secondary | ICD-10-CM

## 2015-02-26 DIAGNOSIS — E785 Hyperlipidemia, unspecified: Secondary | ICD-10-CM | POA: Diagnosis not present

## 2015-02-26 DIAGNOSIS — IMO0001 Reserved for inherently not codable concepts without codable children: Secondary | ICD-10-CM

## 2015-02-26 NOTE — Progress Notes (Signed)
Subjective:    Patient ID: Greg Taylor, male    DOB: 1971-11-16, 44 y.o.   MRN: [id number]  HPI  Patient is a 44 year old male who presents to the clinic for complete physical. He has no problems or complaints today. He is moving to [place] for 2 months for a [employer] course. Just wants to make sure everything is checking out all right.  . Active Ambulatory Problems    Diagnosis Date Noted  . GASTROENTERITIS, VIRAL 10/20/2010  . DERMATOFIBROMA 03/25/2010  . HYPERLIPIDEMIA 08/31/2009  . LEUKOCYTOPENIA UNSPECIFIED 06/12/2009  . CONJUNCTIVITIS, ACUTE, LEFT 04/06/2010  . HYPERTENSION, BENIGN 12/12/2008  . HYPERTENSION 04/06/2010  . ALLERGIC RHINITIS 03/25/2010  . CERVICAL STRAIN 06/18/2010  . Elevated blood pressure 02/26/2015   Resolved Ambulatory Problems    Diagnosis Date Noted  . No Resolved Ambulatory Problems   Past Medical History  Diagnosis Date  . Hypertension    .Marland Kitchen Family History  Problem Relation Age of Onset  . Hypertension Father   . Diabetes Father   . Stroke Maternal Grandfather    . History   Social History  . Marital Status: Married    Spouse Name: N/A  . Number of Children: N/A  . Years of Education: N/A   Occupational History  . Not on file.   Social History Main Topics  . Smoking status: Never Smoker   . Smokeless tobacco: Not on file  . Alcohol Use: No  . Drug Use: No  . Sexual Activity: Not on file   Other Topics Concern  . Not on file   Social History Narrative     Review of Systems  All other systems reviewed and are negative.      Objective:   Physical Exam   BP 142/95 mmHg  Pulse 101  Ht 6\' 2"  (1.88 m)  Wt 204 lb (92.534 kg)  BMI 26.18 kg/m2  General Appearance:    Alert, cooperative, no distress, appears stated age  Head:    Normocephalic, without obvious abnormality, atraumatic  Eyes:    PERRL, conjunctiva/corneas clear, EOM's intact, fundi    benign, both eyes       Ears:    Normal TM's and  external ear canals, both ears  Nose:   Nares normal, septum midline, mucosa normal, no drainage    or sinus tenderness  Throat:   Lips, mucosa, and tongue normal; teeth and gums normal  Neck:   Supple, symmetrical, trachea midline, no adenopathy;       thyroid:  No enlargement/tenderness/nodules; no carotid   bruit or JVD  Back:     Symmetric, no curvature, ROM normal, no CVA tenderness  Lungs:     Clear to auscultation bilaterally, respirations unlabored  Chest wall:    No tenderness or deformity  Heart:    Regular rate and rhythm, S1 and S2 normal, no murmur, rub   or gallop  Abdomen:     Soft, non-tender, bowel sounds active all four quadrants,    no masses, no organomegaly  Genitalia:  Not done.   Rectal:  Not done.   Extremities:   Extremities normal, atraumatic, no cyanosis or edema  Pulses:   2+ and symmetric all extremities  Skin:   Skin color, texture, turgor normal, no rashes or lesions  Lymph nodes:   Cervical, supraclavicular, and axillary nodes normal  Neurologic:   CNII-XII intact. Normal strength, sensation and reflexes      throughout  Assessment & Plan:  CPE- vaccines up to date. No urinary symptoms. AUA-0. Not DRE done. Pt request to have PSA done. No family hx of prostate cancer. Fasting labs ordered. Discussed healthy diet and regular exercise.   Elevated BP- blood pressure remained elevated after second check. We did discuss risk of ongoing elevated blood pressure. Patient really feels like only elevated when he comes into the doctor's office. For the next 2 weeks he is going to check his blood pressure at random times and keep a log. I asked him to return this long and we can consider if medication could be an appropriate option. I also discussed a low salt diet with patient. If blood pressure continues to be elevated we may need to consider medication.

## 2015-02-26 NOTE — Patient Instructions (Signed)

## 2015-02-27 LAB — LIPID PANEL
Cholesterol: 275 mg/dL — ABNORMAL HIGH (ref 0–200)
HDL: 67 mg/dL (ref >=40)
LDL CALC: 198 mg/dL — AB (ref 0–99)
TRIGLYCERIDES: 50 mg/dL (ref <150)
Total CHOL/HDL Ratio: 4.1 Ratio
VLDL: 10 mg/dL (ref 0–40)

## 2015-02-27 LAB — PSA: PSA: 0.24 ng/mL (ref <=4.00)

## 2015-02-27 LAB — COMPLETE METABOLIC PANEL WITH GFR
ALK PHOS: 54 U/L (ref 39–117)
ALT: 42 U/L (ref 0–53)
AST: 40 U/L — ABNORMAL HIGH (ref 0–37)
Albumin: 4 g/dL (ref 3.5–5.2)
BUN: 15 mg/dL (ref 6–23)
CO2: 25 mEq/L (ref 19–32)
CREATININE: 1.09 mg/dL (ref 0.50–1.35)
Calcium: 9.5 mg/dL (ref 8.4–10.5)
Chloride: 99 mEq/L (ref 96–112)
GFR, Est African American: >89 mL/min
GFR, Est Non African American: 83 mL/min
Glucose, Bld: 71 mg/dL (ref 70–99)
POTASSIUM: 4.5 meq/L (ref 3.5–5.3)
SODIUM: 136 meq/L (ref 135–145)
Total Bilirubin: 0.5 mg/dL (ref 0.2–1.2)
Total Protein: 7.6 g/dL (ref 6.0–8.3)

## 2015-02-28 ENCOUNTER — Other Ambulatory Visit: Payer: Self-pay | Admitting: Physician Assistant

## 2015-02-28 MED ORDER — ATORVASTATIN CALCIUM 40 MG PO TABS: 40.0000 mg | ORAL_TABLET | Freq: Every day | ORAL | Status: AC

## 2015-02-28 NOTE — Progress Notes (Signed)
Pt called back and did want to start hyperlipidemia medication. Follow up in within 6 months.
